# Patient Record
Sex: Female | Born: 1935 | Race: White | Hispanic: No | Marital: Married | Smoking: Never smoker
Health system: Southern US, Community
[De-identification: ages and names within clinical notes are randomized; demographics above are authoritative.]

## PROBLEM LIST (undated history)

## (undated) DIAGNOSIS — F32A Depression, unspecified: Secondary | ICD-10-CM

## (undated) DIAGNOSIS — F0390 Unspecified dementia without behavioral disturbance: Secondary | ICD-10-CM

## (undated) DIAGNOSIS — F419 Anxiety disorder, unspecified: Secondary | ICD-10-CM

## (undated) DIAGNOSIS — F329 Major depressive disorder, single episode, unspecified: Secondary | ICD-10-CM

## (undated) DIAGNOSIS — I219 Acute myocardial infarction, unspecified: Secondary | ICD-10-CM

## (undated) DIAGNOSIS — E079 Disorder of thyroid, unspecified: Secondary | ICD-10-CM

## (undated) HISTORY — DX: Major depressive disorder, single episode, unspecified: F32.9

## (undated) HISTORY — PX: THYROID SURGERY: SHX805

## (undated) HISTORY — DX: Disorder of thyroid, unspecified: E07.9

## (undated) HISTORY — DX: Acute myocardial infarction, unspecified: I21.9

## (undated) HISTORY — DX: Depression, unspecified: F32.A

## (undated) HISTORY — DX: Anxiety disorder, unspecified: F41.9

## (undated) HISTORY — PX: CARDIAC CATHETERIZATION: SHX172

## (undated) HISTORY — DX: Unspecified dementia, unspecified severity, without behavioral disturbance, psychotic disturbance, mood disturbance, and anxiety: F03.90

---

## 2004-08-16 ENCOUNTER — Ambulatory Visit: Payer: Self-pay | Admitting: Gastroenterology

## 2004-08-18 ENCOUNTER — Ambulatory Visit: Payer: Self-pay | Admitting: Internal Medicine

## 2005-08-24 ENCOUNTER — Ambulatory Visit: Payer: Self-pay | Admitting: Internal Medicine

## 2008-06-11 ENCOUNTER — Ambulatory Visit: Payer: Self-pay | Admitting: Family Medicine

## 2009-09-27 ENCOUNTER — Ambulatory Visit: Payer: Self-pay | Admitting: Gastroenterology

## 2011-01-03 ENCOUNTER — Ambulatory Visit: Payer: Self-pay | Admitting: Family Medicine

## 2012-05-19 DIAGNOSIS — E7800 Pure hypercholesterolemia, unspecified: Secondary | ICD-10-CM | POA: Insufficient documentation

## 2012-05-21 DIAGNOSIS — G250 Essential tremor: Secondary | ICD-10-CM | POA: Insufficient documentation

## 2012-08-14 ENCOUNTER — Ambulatory Visit: Payer: Self-pay | Admitting: Unknown Physician Specialty

## 2012-12-18 DIAGNOSIS — Z800 Family history of malignant neoplasm of digestive organs: Secondary | ICD-10-CM | POA: Insufficient documentation

## 2013-06-05 ENCOUNTER — Ambulatory Visit: Payer: Self-pay | Admitting: Gastroenterology

## 2013-06-09 ENCOUNTER — Ambulatory Visit: Payer: Self-pay | Admitting: Gastroenterology

## 2013-06-12 LAB — PATHOLOGY REPORT

## 2014-06-08 DIAGNOSIS — R2689 Other abnormalities of gait and mobility: Secondary | ICD-10-CM | POA: Insufficient documentation

## 2014-07-07 ENCOUNTER — Ambulatory Visit
Admit: 2014-07-07 | Disposition: A | Discharge status: Home / Self Care | Payer: Self-pay | Attending: Neurology | Admitting: Neurology

## 2014-08-06 DIAGNOSIS — E538 Deficiency of other specified B group vitamins: Secondary | ICD-10-CM | POA: Insufficient documentation

## 2014-12-06 IMAGING — CT CT ABD-PELV W/ CM
2 of 5 series · 17 of 46 positions shown, 19 images · IV contrast (isovue)
Comparison: None.

CLINICAL DATA: Right lower quadrant abdominal pain. 20 lb weight
loss.

EXAM:
CT ABDOMEN AND PELVIS WITH CONTRAST
TECHNIQUE: Multidetector CT imaging of the abdomen and pelvis was performed
using the standard protocol following bolus administration of
intravenous contrast.
CONTRAST:  100 cc Isovue 370

[Series 2: axial soft tissue · axial · 0.66mm/px · z∈[-947,-587]mm · 14 of 82 slices shown, 16 images]
[im 5/82  soft-tissue]
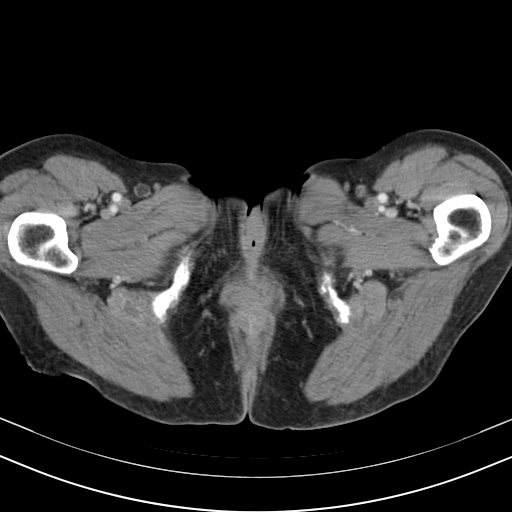
[im 5/82  bone]
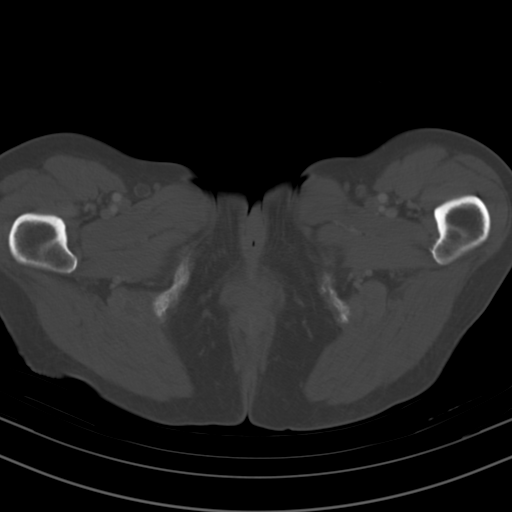
[im 10/82  soft-tissue]
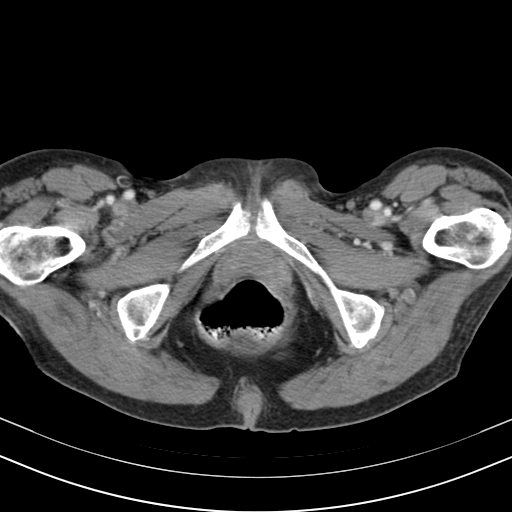
[im 15/82  soft-tissue]
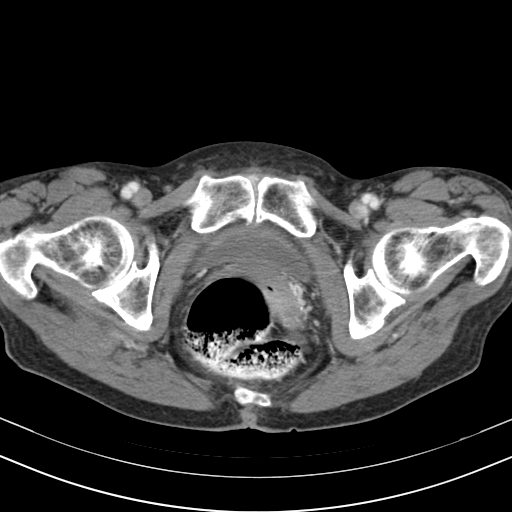
[im 24/82  soft-tissue]
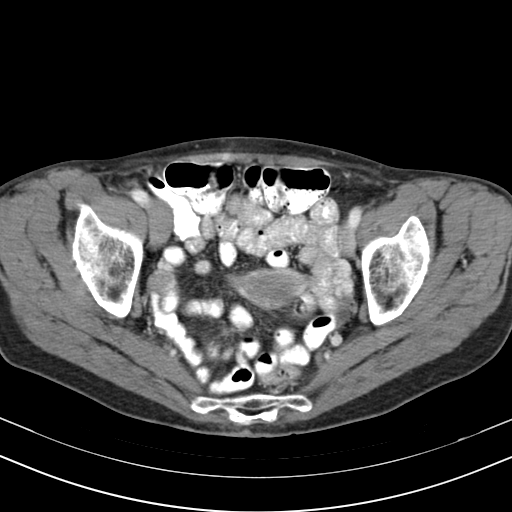
[im 29/82  soft-tissue]
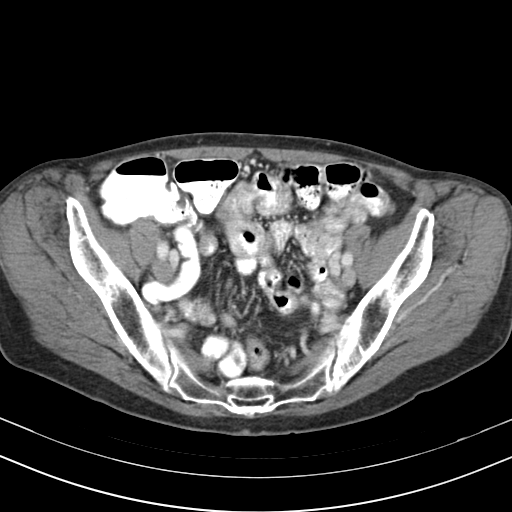
[im 34/82  soft-tissue]
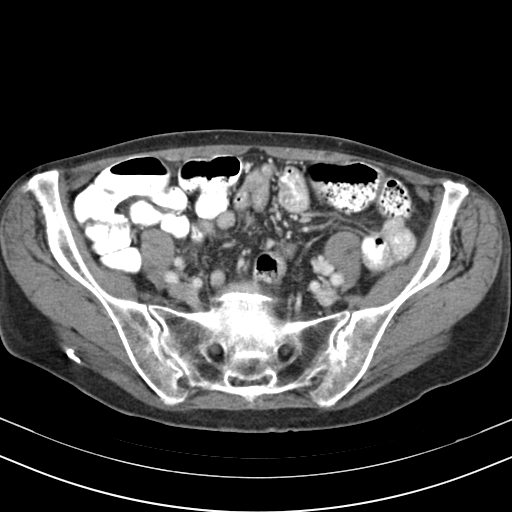
[im 39/82  soft-tissue]
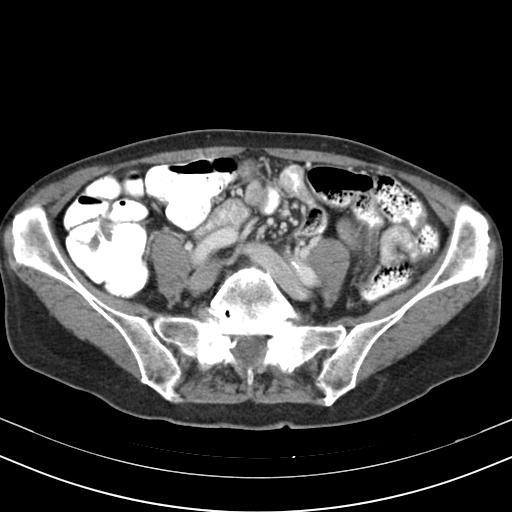
[im 43/82  soft-tissue]
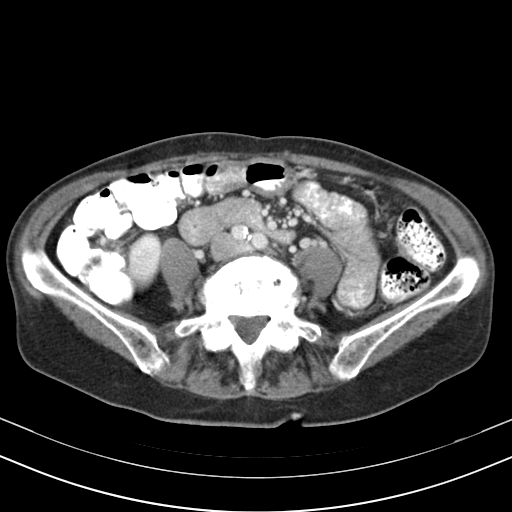
[im 48/82  soft-tissue]
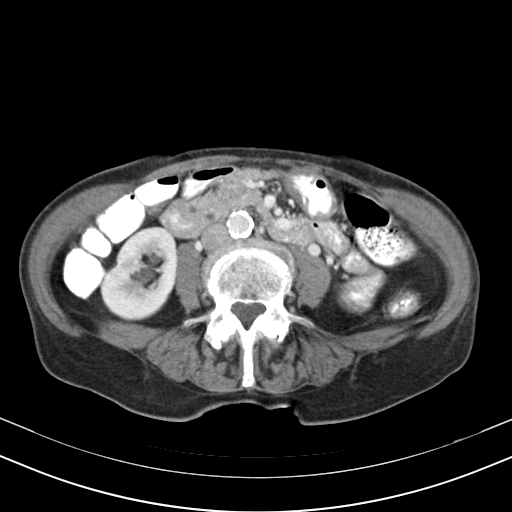
[im 48/82  bone]
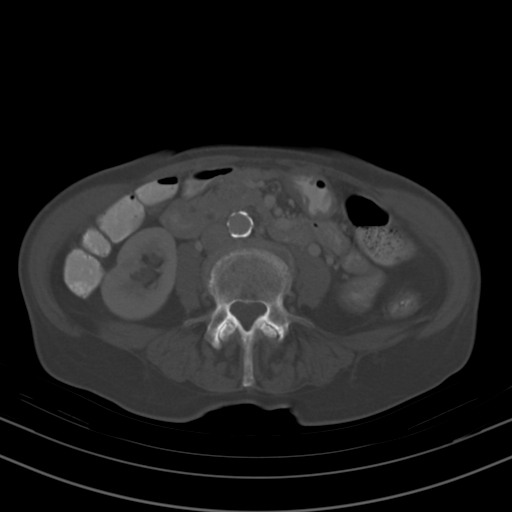
[im 53/82  soft-tissue]
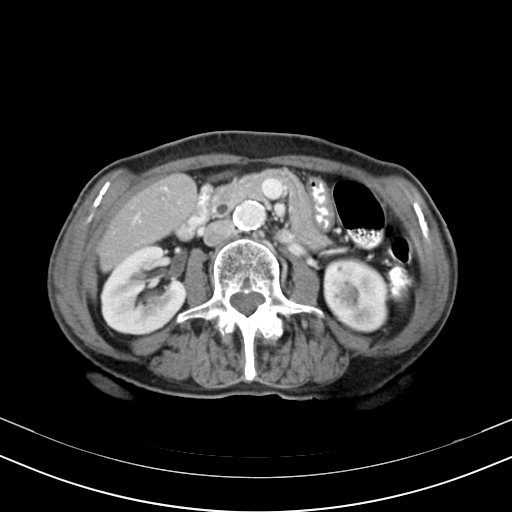
[im 62/82  soft-tissue]
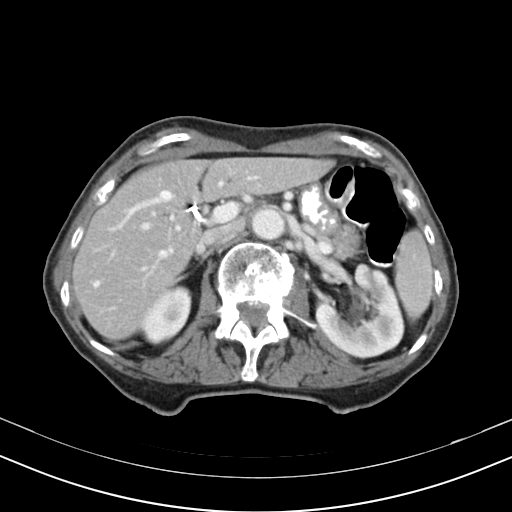
[im 67/82  soft-tissue]
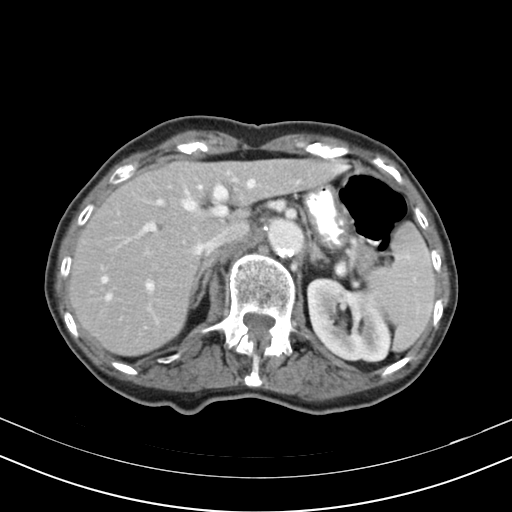
[im 72/82  soft-tissue]
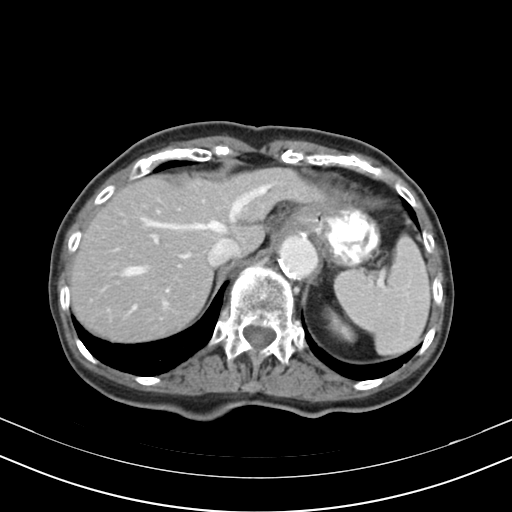
[im 77/82  soft-tissue]
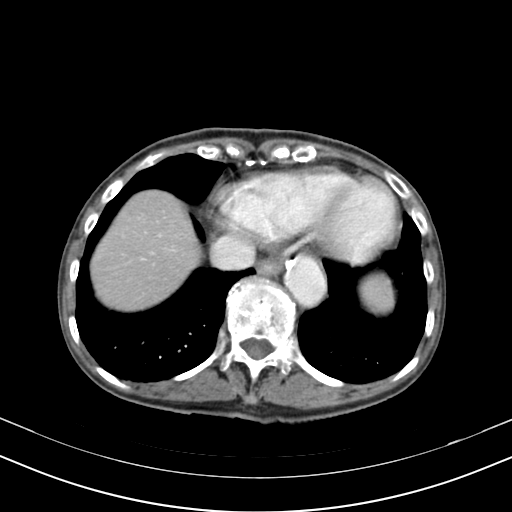

[Series 602: coronal · coronal · 0.79mm/px · 3 of 81 slices shown]
[im 27/81  soft-tissue]
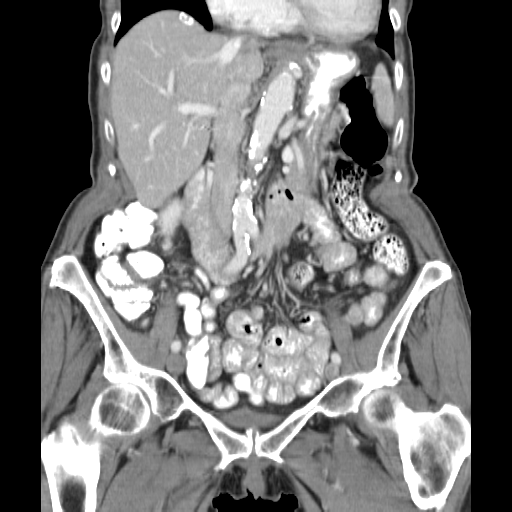
[im 36/81  soft-tissue]
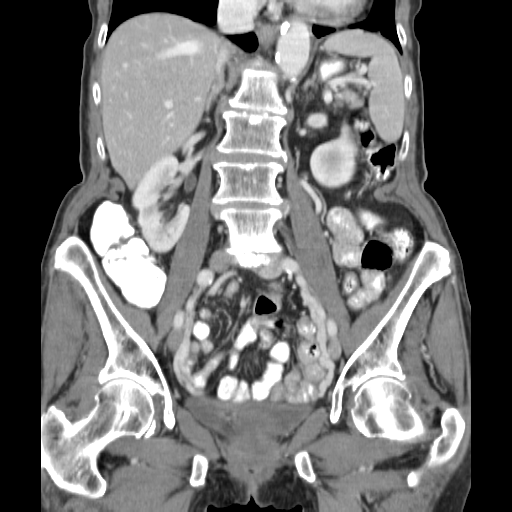
[im 45/81  soft-tissue]
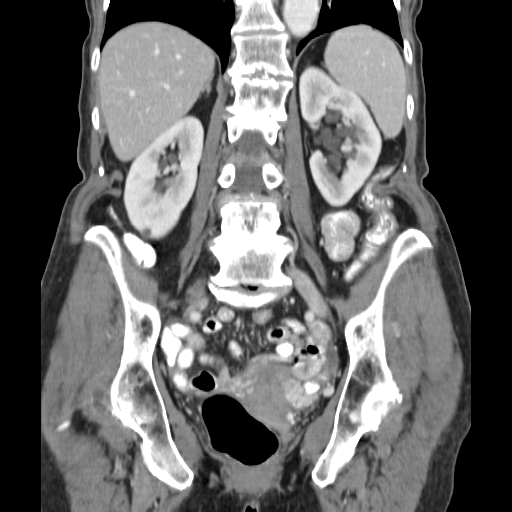

[17 of 46 positions shown; findings below may reference images not displayed]

FINDINGS: The lung bases are clear. No pulmonary nodules or pleural effusion.
The heart is mildly enlarged. No pericardial effusion. There is
tortuosity, ectasia and calcification of the thoracic aorta. The
esophagus is grossly normal

The liver is unremarkable. There is a calcified lesion in the right
hepatic lobe near the dome which is likely a benign granuloma. The
gallbladder is surgically absent. No common bowel duct dilatation.
The pancreas is unremarkable. The spleen is normal in size. No focal
lesions. The adrenal glands and kidneys are unremarkable. Small
renal cysts are noted.

The stomach, duodenum, small bowel and colon are unremarkable. No
inflammatory changes, mass lesions or obstructive findings. No
mesenteric or retroperitoneal mass or adenopathy. There is
tortuosity, ectasia and atherosclerotic calcifications involving the
aorta. No focal aneurysm or dissection.

The uterus and ovaries are unremarkable. No pelvic mass, adenopathy
or free pelvic fluid collections. No inguinal mass or adenopathy.

The bony structures are unremarkable. Moderate degenerative changes
are noted in the spine.
IMPRESSION: No acute abdominal/ pelvic findings, mass lesions or adenopathy.

Moderate to advanced atherosclerotic calcifications involving the
aorta and branch vessels.

## 2015-02-08 DIAGNOSIS — F0390 Unspecified dementia without behavioral disturbance: Secondary | ICD-10-CM | POA: Insufficient documentation

## 2015-02-08 DIAGNOSIS — F03C0 Unspecified dementia, severe, without behavioral disturbance, psychotic disturbance, mood disturbance, and anxiety: Secondary | ICD-10-CM | POA: Insufficient documentation

## 2015-05-18 ENCOUNTER — Ambulatory Visit: Payer: Self-pay | Admitting: Psychiatry

## 2015-05-25 ENCOUNTER — Ambulatory Visit (INDEPENDENT_AMBULATORY_CARE_PROVIDER_SITE_OTHER): Payer: Medicare Other | Admitting: Psychiatry

## 2015-05-25 ENCOUNTER — Encounter: Payer: Self-pay | Admitting: Psychiatry

## 2015-05-25 DIAGNOSIS — F411 Generalized anxiety disorder: Secondary | ICD-10-CM

## 2015-05-25 DIAGNOSIS — M545 Low back pain, unspecified: Secondary | ICD-10-CM | POA: Insufficient documentation

## 2015-05-25 DIAGNOSIS — I10 Essential (primary) hypertension: Secondary | ICD-10-CM | POA: Insufficient documentation

## 2015-05-25 DIAGNOSIS — J309 Allergic rhinitis, unspecified: Secondary | ICD-10-CM | POA: Insufficient documentation

## 2015-05-25 DIAGNOSIS — I2510 Atherosclerotic heart disease of native coronary artery without angina pectoris: Secondary | ICD-10-CM | POA: Insufficient documentation

## 2015-05-25 DIAGNOSIS — F419 Anxiety disorder, unspecified: Secondary | ICD-10-CM | POA: Insufficient documentation

## 2015-05-25 DIAGNOSIS — K219 Gastro-esophageal reflux disease without esophagitis: Secondary | ICD-10-CM | POA: Insufficient documentation

## 2015-05-25 DIAGNOSIS — E039 Hypothyroidism, unspecified: Secondary | ICD-10-CM | POA: Insufficient documentation

## 2015-05-25 DIAGNOSIS — M1990 Unspecified osteoarthritis, unspecified site: Secondary | ICD-10-CM | POA: Insufficient documentation

## 2015-05-25 DIAGNOSIS — I719 Aortic aneurysm of unspecified site, without rupture: Secondary | ICD-10-CM | POA: Insufficient documentation

## 2015-05-25 DIAGNOSIS — N3941 Urge incontinence: Secondary | ICD-10-CM | POA: Insufficient documentation

## 2015-05-25 DIAGNOSIS — F329 Major depressive disorder, single episode, unspecified: Secondary | ICD-10-CM | POA: Insufficient documentation

## 2015-05-25 MED ORDER — SERTRALINE HCL 100 MG PO TABS: 150.0000 mg | ORAL_TABLET | Freq: Every day | ORAL | Status: DC

## 2015-05-25 NOTE — Progress Notes (Signed)
Psychiatric Initial Adult Assessment   Patient Identification: Julie Everett MRN:  [id number] Date of Evaluation:  05/25/2015 Referral Source: Neurologist Chief Complaint:   Chief Complaint    Establish Care; Anxiety; Depression; Stress; Fatigue     Visit Diagnosis: No diagnosis found. Diagnosis:   Patient Active Problem List   Diagnosis Date Noted  . Acquired hypothyroidism [E03.9] 05/25/2015  . Allergic rhinitis [J30.9] 05/25/2015  . Anxiety and depression [F41.8] 05/25/2015  . Aortic aneurysm (HCC) [I71.9] 05/25/2015  . Arteriosclerosis of coronary artery [I25.10] 05/25/2015  . Essential (primary) hypertension [I10] 05/25/2015  . Acid reflux [K21.9] 05/25/2015  . LBP (low back pain) [M54.5] 05/25/2015  . Arthritis, degenerative [M19.90] 05/25/2015  . Urge incontinence of urine [N39.41] 05/25/2015  . Advanced dementia [F03.90] 02/08/2015  . B12 deficiency [E53.8] 08/06/2014  . Imbalance [R26.89] 06/08/2014  . Family history of colon cancer [Z80.0] 12/18/2012  . Benign essential tremor [G25.0] 05/21/2012  . Hypercholesterolemia [E78.00] 05/19/2012   History of Present Illness:  Patient is a 82 yo female with history of Anxiety and Depression who presents to this clinic to establish care and was referred by her neurologist. Patient is accompanied by her daughter-in-law. Patient reports she has been feeling depressed for a few years but that it got worse over the past 4-5 months. She has been treated by her primary care physician until recently and currently takes Zoloft 100 mg daily. She was also concern about memory loss and was seeing a neurologist who had added Namenda just last month. Prior to that she had also been taking Aricept 10 mg. Per daughter-in-law patient's depression has gotten worse in the last few months and she states that patient had 9 siblings who have all died in the last few years. Patient reports that when she wakes up in the morning she starts crying  and feels empty. States that this happens several times throughout the week. She feels like she is not enjoying her life. She lives with her husband on a farm. Most of her family also lives in different houses on the farm. Reports  that they are a very close family and see each other all the time. Patient is hard of hearing and has hearing aids in both her ears. States this causes a lot of anxiety and she is afraid to drive are going to stores. She is embarrassed of not understanding what people say and this makes her not socialize. Patient reports sleeping well and eating okay. Per daughter-in-law patient has become dizzy a few times on the lorazepam that she takes. Since starting Namenda last month patient has not seen any improvement but wonders if her anxiety has gotten a bit worse. She is not exhibiting any memory issues throughout the session. She has never been seen by psychiatrist. She has never Been hospitalized psychiatrically. Denies use of alcohol or any other drugs. Denies any psychotic symptoms.  Associated Signs/Symptoms: Depression Symptoms:  depressed mood, anhedonia, psychomotor agitation, hopelessness, anxiety, (Hypo) Manic Symptoms:  denies Anxiety Symptoms:  Excessive Worry, Psychotic Symptoms:  denies PTSD Symptoms: denies  Past Medical History:  Past Medical History  Diagnosis Date  . Heart attack (HCC)   . Anxiety   . Depression   . Dementia   . Thyroid disease     Past Surgical History  Procedure Laterality Date  . Thyroid surgery    . Cardiac catheterization     Family History:  Family History  Problem Relation Age of Onset  . Colon cancer Mother   .  Alcohol abuse Father   . Alzheimer's disease Father   . Breast cancer Sister   . Heart attack Sister   . Heart disease Sister   . Dementia Sister   . Alcohol abuse Brother    Social History:   Social History   Social History  . Marital Status: Married    Spouse Name: N/A  . Number of Children: N/A   . Years of Education: N/A   Social History Main Topics  . Smoking status: Never Smoker   . Smokeless tobacco: Never Used  . Alcohol Use: No  . Drug Use: No  . Sexual Activity: No   Other Topics Concern  . None   Social History Narrative  . None   Additional Social History: Patient has been married for 60 years and lives with her husband on a farm that is 300[place] acres in [place]. Her son and grandchildren all live on the farm. She reports having a close knit family.  Musculoskeletal: Strength & Muscle Tone: decreased Gait & Station: broad based Patient leans: N/A  Psychiatric Specialty Exam: HPI  ROS  Blood pressure 122/68, pulse 60, temperature 97.3 F (36.3 C), temperature source Tympanic, height 5\' 5"  (1.651 m), weight 122 lb 12.8 oz (55.702 kg), SpO2 97 %.Body mass index is 20.44 kg/(m^2).  General Appearance: Fairly Groomed  Eye Contact:  Fair  Speech:  Clear and Coherent  Volume:  Normal  Mood:  Depressed and Dysphoric  Affect:  Full Range  Thought Process:  Coherent  Orientation:  Full (Time, Place, and Person)  Thought Content:  Rumination  Suicidal Thoughts:  No  Homicidal Thoughts:  No  Memory:  Immediate;   Fair Recent;   Fair Remote;   Fair  Judgement:  Fair  Insight:  Fair  Psychomotor Activity:  Normal  Concentration:  Fair  Recall:  [employer] of Knowledge:Fair  Language: Fair  Akathisia:  No  Handed:  Right  AIMS (if indicated):    Assets:  Communication Skills Desire for Improvement Financial Resources/Insurance Housing Social Support  ADL's:  Intact  Cognition: WNL  Sleep:  fair   Is the patient at risk to self?  No. Has the patient been a risk to self in the past 6 months?  No. Has the patient been a risk to self within the distant past?  No. Is the patient a risk to others?  No. Has the patient been a risk to others in the past 6 months?  No. Has the patient been a risk to others within the distant past?  No.  Allergies:    Allergies  Allergen Reactions  . Codeine Other (See Comments)  . Primidone Other (See Comments)   Current Medications: Current Outpatient Prescriptions  Medication Sig Dispense Refill  . aspirin EC 81 MG tablet Take 81 mg by mouth.    Marland Kitchen atorvastatin (LIPITOR) 80 MG tablet Take 80 mg by mouth.    . donepezil (ARICEPT) 10 MG tablet 10 mg.    . levothyroxine (SYNTHROID) 75 MCG tablet 75 mcg.    . lisinopril (PRINIVIL,ZESTRIL) 10 MG tablet Take 10 mg by mouth.    Marland Kitchen LORazepam (ATIVAN) 0.5 MG tablet Take 0.5 mg by mouth.    . memantine (NAMENDA) 10 MG tablet Take 10 mg by mouth.    . oxybutynin (DITROPAN-XL) 5 MG 24 hr tablet Take 5 mg by mouth.    . sertraline (ZOLOFT) 100 MG tablet Take 100 mg by mouth.     No  current facility-administered medications for this visit.    Previous Psychotropic Medications: No   Substance Abuse History in the last 12 months:  No.  Consequences of Substance Abuse: Negative  Medical Decision Making:  Review of Psycho-Social Stressors (1), Review and summation of old records (2), Established Problem, Worsening (2) and Review of New Medication or Change in Dosage (2)  Treatment Plan Summary: Medication management   Major depressive disorder Increase Zoloft to 150 mg by mouth daily Strategies patient can employee at home, walking for 20 minutes daily, spending time in well lighted areas. Discontinue alprazolam Discontinue Namenda  Anxiety Same as above  Return to clinic in 1 week's time or call before if necessary    Layza Summa 2/21/20172:03 PM

## 2015-06-07 ENCOUNTER — Ambulatory Visit (INDEPENDENT_AMBULATORY_CARE_PROVIDER_SITE_OTHER): Payer: Medicare Other | Admitting: Psychiatry

## 2015-06-07 ENCOUNTER — Encounter: Payer: Self-pay | Admitting: Psychiatry

## 2015-06-07 DIAGNOSIS — F419 Anxiety disorder, unspecified: Secondary | ICD-10-CM | POA: Diagnosis not present

## 2015-06-07 DIAGNOSIS — F339 Major depressive disorder, recurrent, unspecified: Secondary | ICD-10-CM | POA: Diagnosis not present

## 2015-06-07 MED ORDER — SERTRALINE HCL 100 MG PO TABS: 150.0000 mg | ORAL_TABLET | Freq: Every day | ORAL | Status: DC

## 2015-06-07 NOTE — Progress Notes (Signed)
Patient ID: Julie Everett, female   DOB: 10/27/1933, 82 y.o.   MRN: [id number]  Psychiatric Progress Note  Patient Identification: Julie Everett MRN:  [id number] Referral Source: Neurologist Chief Complaint:   Depression medication follow up  Visit Diagnosis: Major depressive disorder Diagnosis:   Patient Active Problem List   Diagnosis Date Noted  . Acquired hypothyroidism [E03.9] 05/25/2015  . Allergic rhinitis [J30.9] 05/25/2015  . Anxiety and depression [F41.8] 05/25/2015  . Aortic aneurysm (HCC) [I71.9] 05/25/2015  . Arteriosclerosis of coronary artery [I25.10] 05/25/2015  . Essential (primary) hypertension [I10] 05/25/2015  . Acid reflux [K21.9] 05/25/2015  . LBP (low back pain) [M54.5] 05/25/2015  . Arthritis, degenerative [M19.90] 05/25/2015  . Urge incontinence of urine [N39.41] 05/25/2015  . Advanced dementia [F03.90] 02/08/2015  . B12 deficiency [E53.8] 08/06/2014  . Imbalance [R26.89] 06/08/2014  . Family history of colon cancer [Z80.0] 12/18/2012  . Benign essential tremor [G25.0] 05/21/2012  . Hypercholesterolemia [E78.00] 05/19/2012   History of Present Illness:  Patient is a 82 yo female with history of Anxiety and Depression who presents for a follow up.She was accompanied by her husband today. Patient is hard of hearing and has to have questions repeated. How ever patient reports that she is feeling much better. Her husband also states that he seen any improvement since she stopped the Namenda. She has been taking the Zoloft at 150 mg. States she is not crying at all. She is enjoying her time with her grandkids though they tired her out.  She has been sleeping better and eating better. She stays active by running after her grandchildren. She states that the empty feeling has gone. We discussed her memory issues, but patient states that she cannot hear well and they have tried new hearing aids. Discussed with them that since she does not hear well  she is unable to retain new information very well. However she functions quite well in all other areas. Patient presenting with a smiling face and looks cheerful throughout the session.  Past Medical History:  Past Medical History  Diagnosis Date  . Heart attack (HCC)   . Anxiety   . Depression   . Dementia   . Thyroid disease     Past Surgical History  Procedure Laterality Date  . Thyroid surgery    . Cardiac catheterization     Family History:  Family History  Problem Relation Age of Onset  . Colon cancer Mother   . Alcohol abuse Father   . Alzheimer's disease Father   . Breast cancer Sister   . Heart attack Sister   . Heart disease Sister   . Dementia Sister   . Alcohol abuse Brother    Social History:   Social History   Social History  . Marital Status: Married    Spouse Name: N/A  . Number of Children: N/A  . Years of Education: N/A   Social History Main Topics  . Smoking status: Never Smoker   . Smokeless tobacco: Never Used  . Alcohol Use: No  . Drug Use: No  . Sexual Activity: No   Other Topics Concern  . None   Social History Narrative   Additional Social History: Patient has been married for 60 years and lives with her husband on a farm that is 300+ acres in [place]. Her son and grandchildren all live on the farm. She reports having a close knit family.  Musculoskeletal: Strength & Muscle Tone: decreased Gait & Station: broad based Patient  leans: N/A  Psychiatric Specialty Exam: Anxiety    Depression        Past medical history includes anxiety.     Review of Systems  Psychiatric/Behavioral: Positive for depression.    Blood pressure 138/82, pulse 62, temperature 97.7 F (36.5 C), temperature source Tympanic, height 5\' 5"  (1.651 m), weight 123 lb 12.8 oz (56.155 kg), SpO2 98 %.Body mass index is 20.6 kg/(m^2).  General Appearance: Fairly Groomed  Eye Contact:  Fair  Speech:  Clear and Coherent  Volume:  Normal  Mood:  Much  improved   Affect:  Full Range  Thought Process:  Coherent  Orientation:  Full (Time, Place, and Person)  Thought Content:  Rumination  Suicidal Thoughts:  No  Homicidal Thoughts:  No  Memory:  Immediate;   Fair Recent;   Fair Remote;   Fair  Judgement:  Fair  Insight:  Fair  Psychomotor Activity:  Normal  Concentration:  Fair  Recall:  [employer]  Fund of Knowledge:Fair  Language: Fair  Akathisia:  No  Handed:  Right  AIMS (if indicated):    Assets:  Communication Skills Desire for Improvement Financial Resources/Insurance Housing Social Support  ADL's:  Intact  Cognition: WNL  Sleep:  fair   Is the patient at risk to self?  No. Has the patient been a risk to self in the past 6 months?  No. Has the patient been a risk to self within the distant past?  No. Is the patient a risk to others?  No. Has the patient been a risk to others in the past 6 months?  No. Has the patient been a risk to others within the distant past?  No.  Allergies:   Allergies  Allergen Reactions  . Codeine Other (See Comments)  . Primidone Other (See Comments)   Current Medications: Current Outpatient Prescriptions  Medication Sig Dispense Refill  . aspirin EC 81 MG tablet Take 81 mg by mouth.    Marland Kitchen. atorvastatin (LIPITOR) 80 MG tablet Take 80 mg by mouth.    . donepezil (ARICEPT) 10 MG tablet 10 mg.    . esomeprazole (NEXIUM) 40 MG packet Take 40 mg by mouth daily before breakfast.    . levothyroxine (SYNTHROID) 75 MCG tablet 75 mcg.    . lisinopril (PRINIVIL,ZESTRIL) 10 MG tablet Take 10 mg by mouth.    . metoprolol succinate (TOPROL-XL) 50 MG 24 hr tablet Take 50 mg by mouth daily. Take with or immediately following a meal.    . oxybutynin (DITROPAN-XL) 5 MG 24 hr tablet Take 5 mg by mouth.    . sertraline (ZOLOFT) 100 MG tablet Take 1.5 tablets (150 mg total) by mouth daily. 45 tablet 0   No current facility-administered medications for this visit.    Previous Psychotropic Medications: No    Substance Abuse History in the last 12 months:  No.  Consequences of Substance Abuse: Negative  Medical Decision Making:  Review of Psycho-Social Stressors (1), Review and summation of old records (2), Established Problem, Worsening (2) and Review of New Medication or Change in Dosage (2)  Treatment Plan Summary: Medication management   Major depressive disorder Continue Zoloft to 150 mg by mouth daily, also discussed taking the Zoloft as 100 mg in the morning and 50 mg in the evening to reduce the side effects of possible dizziness. Encouraged patient to take walks around her house and sit in well lighted area.  Anxiety Same as above  Return to clinic in 1 month time  or call before if necessary    Tylea Hise 3/6/20172:30 PM

## 2015-06-10 ENCOUNTER — Ambulatory Visit: Payer: Self-pay | Admitting: Psychiatry

## 2015-07-07 ENCOUNTER — Ambulatory Visit (INDEPENDENT_AMBULATORY_CARE_PROVIDER_SITE_OTHER): Payer: Medicare Other | Admitting: Psychiatry

## 2015-07-07 ENCOUNTER — Encounter: Payer: Self-pay | Admitting: Psychiatry

## 2015-07-07 VITALS — BP 122/84 | HR 64 | Temp 98.5°F | Ht 65.0 in | Wt 121.4 lb

## 2015-07-07 DIAGNOSIS — F330 Major depressive disorder, recurrent, mild: Secondary | ICD-10-CM

## 2015-07-07 MED ORDER — SERTRALINE HCL 100 MG PO TABS: 150.0000 mg | ORAL_TABLET | Freq: Every day | ORAL | Status: DC

## 2015-07-07 NOTE — Progress Notes (Signed)
Patient ID: Julie Everett, female   DOB: 02-20-1934, 82 y.o.   MRN: [id number]   Psychiatric Progress Note  Patient Identification: Julie Everett MRN:  [id number] Referral Source: Neurologist Chief Complaint:   Depression medication follow up  Visit Diagnosis: Major depressive disorder     History of Present Illness:  Patient is a 82 yo female with history of Anxiety and Depression who presents for a follow up. He shouldn't seen today with her husband. Patient is hard of hearing and this clinician has to talk very loudly for patient to hear. Per husband patient is doing okay. She is sleeping well and eating well. Patient reports 1-2 crying episodes since her last visit. Husband reports that patient takes care of their great-grandchildren 4 days a week. States that they have a 80-year-old for 2 weekdays and a 80-year-old on the weekends. He does think that it is a bit too much for his wife. Discussed with patient about cutting down the hours that she takes care of the grandkids but she states that she would like to help out as much as possible. Patient looks a bit more withdrawn this visit. However she denies any suicidal thoughts or depressed mood as she presented the first time. Per her husband she's been diagnosed with Parkinson's.   Past Medical History:  Past Medical History  Diagnosis Date  . Heart attack (HCC)   . Anxiety   . Depression   . Dementia   . Thyroid disease     Past Surgical History  Procedure Laterality Date  . Thyroid surgery    . Cardiac catheterization     Family History:  Family History  Problem Relation Age of Onset  . Colon cancer Mother   . Alcohol abuse Father   . Alzheimer's disease Father   . Breast cancer Sister   . Heart attack Sister   . Heart disease Sister   . Dementia Sister   . Alcohol abuse Brother    Social History:   Social History   Social History  . Marital Status: Married    Spouse Name: N/A  . Number of  Children: N/A  . Years of Education: N/A   Social History Main Topics  . Smoking status: Never Smoker   . Smokeless tobacco: Never Used  . Alcohol Use: No  . Drug Use: No  . Sexual Activity: No   Other Topics Concern  . None   Social History Narrative   Additional Social History: Patient has been married for 60 years and lives with her husband on a farm [place]. Her son and grandchildren all live on the farm. She reports having a close knit family.  Musculoskeletal: Strength & Muscle Tone: decreased Gait & Station: broad based Patient leans: N/A  Psychiatric Specialty Exam: Anxiety    Depression        Past medical history includes anxiety.     Review of Systems  Psychiatric/Behavioral: Positive for depression.    Blood pressure 122/84, pulse 64, temperature 98.5 F (36.9 C), temperature source Tympanic, height 5\' 5"  (1.651 m), weight 121 lb 6.4 oz (55.067 kg), SpO2 95 %.Body mass index is 20.2 kg/(m^2).  General Appearance: Fairly Groomed  Eye Contact:  Fair  Speech:  Clear and Coherent  Volume:  Normal  Mood:  Much improved   Affect:  Full Range  Thought Process:  Coherent  Orientation:  Full (Time, Place, and Person)  Thought Content:  Rumination  Suicidal Thoughts:  No  Homicidal Thoughts:  No  Memory:  Immediate;   Fair Recent;   Fair Remote;   Fair  Judgement:  Fair  Insight:  Fair  Psychomotor Activity:  Normal  Concentration:  Fair  Recall:  [employer] of Knowledge:Fair  Language: Fair  Akathisia:  No  Handed:  Right  AIMS (if indicated):    Assets:  Communication Skills Desire for Improvement Financial Resources/Insurance Housing Social Support  ADL's:  Intact  Cognition: WNL  Sleep:  fair   Is the patient at risk to self?  No. Has the patient been a risk to self in the past 6 months?  No. Has the patient been a risk to self within the distant past?  No. Is the patient a risk to others?  No. Has the patient  been a risk to others in the past 6 months?  No. Has the patient been a risk to others within the distant past?  No.  Allergies:   Allergies  Allergen Reactions  . Codeine Other (See Comments)  . Primidone Other (See Comments)   Current Medications: Current Outpatient Prescriptions  Medication Sig Dispense Refill  . aspirin EC 81 MG tablet Take 81 mg by mouth.    Marland Kitchen atorvastatin (LIPITOR) 80 MG tablet Take 80 mg by mouth.    . donepezil (ARICEPT) 10 MG tablet 10 mg.    . esomeprazole (NEXIUM) 40 MG packet Take 40 mg by mouth daily before breakfast.    . levothyroxine (SYNTHROID) 75 MCG tablet 75 mcg.    . lisinopril (PRINIVIL,ZESTRIL) 10 MG tablet Take 10 mg by mouth.    . metoprolol succinate (TOPROL-XL) 50 MG 24 hr tablet Take 50 mg by mouth daily. Take with or immediately following a meal.    . oxybutynin (DITROPAN-XL) 5 MG 24 hr tablet Take 5 mg by mouth.    . sertraline (ZOLOFT) 100 MG tablet Take 1.5 tablets (150 mg total) by mouth daily. 45 tablet 1   No current facility-administered medications for this visit.    Previous Psychotropic Medications: No   Substance Abuse History in the last 12 months:  No.  Consequences of Substance Abuse: Negative  Medical Decision Making:  Review of Psycho-Social Stressors (1), Review and summation of old records (2), Established Problem, Worsening (2) and Review of New Medication or Change in Dosage (2)  Treatment Plan Summary: Medication management   Major depressive disorder Continue Zoloft to 150 mg by mouth daily. Recommend that patient reduce her hours of babysitting for her grandkids. Discussed that she needs to focus on her own health and rest well.  Anxiety Same as above  Return to clinic in 2 month time or call before if necessary    Zygmund Passero 4/5/20171:58 PM

## 2015-08-18 ENCOUNTER — Other Ambulatory Visit: Payer: Self-pay | Admitting: Psychiatry

## 2015-10-06 ENCOUNTER — Ambulatory Visit: Payer: No Typology Code available for payment source | Admitting: Psychiatry

## 2016-08-10 ENCOUNTER — Other Ambulatory Visit: Payer: Self-pay | Admitting: Psychiatry

## 2016-08-17 ENCOUNTER — Other Ambulatory Visit: Payer: Self-pay | Admitting: Unknown Physician Specialty

## 2016-08-17 DIAGNOSIS — Z780 Asymptomatic menopausal state: Secondary | ICD-10-CM

## 2018-12-03 DEATH — deceased
